# Patient Record
Sex: Female | Born: 2000
Health system: Southern US, Community
[De-identification: ages and names within clinical notes are randomized; demographics above are authoritative.]

## PROBLEM LIST (undated history)

## (undated) DIAGNOSIS — H1010 Acute atopic conjunctivitis, unspecified eye: Secondary | ICD-10-CM

## (undated) DIAGNOSIS — E079 Disorder of thyroid, unspecified: Secondary | ICD-10-CM

## (undated) DIAGNOSIS — L309 Dermatitis, unspecified: Secondary | ICD-10-CM

## (undated) HISTORY — PX: OTHER SURGICAL HISTORY: SHX169

## (undated) HISTORY — PX: WISDOM TOOTH EXTRACTION: SHX21

## (undated) HISTORY — DX: Dermatitis, unspecified: L30.9

## (undated) HISTORY — DX: Acute atopic conjunctivitis, unspecified eye: H10.10

---
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
## 2021-02-05 NOTE — Patient Instructions (Signed)
Preventive Care 18-19 Years Old, Female Preventive care refers to lifestyle choices and visits with your health care provider that can promote health and wellness. At this stage in your life, you may start seeing a primary care physician instead of a pediatrician. It is important to take responsibility for your health and well-being. Preventive care for young adults includes: A yearly physical exam. This is also called an annual wellness visit. Regular dental and eye exams. Immunizations. Screening for certain conditions. Healthy lifestyle choices, such as: Eating a healthy diet. Getting regular exercise. Not using drugs or products that contain nicotine and tobacco. Limiting alcohol use. What can I expect for my preventive care visit? Physical exam Your health care provider may check your: Height and weight. These may be used to calculate your BMI (body mass index). BMI is a measurement that tells if you are at a healthy weight. Heart rate and blood pressure. Body temperature. Skin for abnormal spots. Counseling Your health care provider may ask you questions about your: Past medical problems. Family's medical history. Alcohol, tobacco, and drug use. Home life and relationship well-being. Access to firearms. Emotional well-being. Diet, exercise, and sleep habits. Sexual activity and sexual health. Method of birth control. Menstrual cycle. Pregnancy history. What immunizations do I need? Vaccines are usually given at various ages, according to a schedule. Your health care provider will recommend vaccines for you based on your age, medical history, and lifestyle or other factors, such as travel or where you work. What tests do I need? Blood tests Lipid and cholesterol levels. These may be checked every 5 years starting at age 20. Hepatitis C test. Hepatitis B test. Screening Pelvic exam and Pap test. This may be done every 3 years starting at age 19. STD (sexually transmitted  disease) testing, if you are at risk. BRCA-related cancer screening. This may be done if you have a family history of breast, ovarian, tubal, or peritoneal cancers. Other tests Tuberculosis skin test. Vision and hearing tests. Skin exam. Breast exam. Talk with your health care provider about your test results, treatment options, and if necessary, the need for more tests. Follow these instructions at home: Eating and drinking  Eat a healthy diet that includes fresh fruits and vegetables, whole grains, lean protein, and low-fat dairy products. Drink enough fluid to keep your urine pale yellow. Do not drink alcohol if: Your health care provider tells you not to drink. You are pregnant, may be pregnant, or are planning to become pregnant. You are under the legal drinking age. In the [place] the legal drinking age is 21. If you drink alcohol: Limit how much you use to 0-1 drink a day. Be aware of how much alcohol is in your drink. In the [place] one drink equals one 12 oz bottle of beer (355 mL), one 5 oz glass of wine (148 mL), or one 1 oz glass of hard liquor (44 mL). Lifestyle Take daily care of your teeth and gums. Brush your teeth every morning and night with fluoride toothpaste. Floss one time each day. Stay active. Exercise for at least 30 minutes 5 or more days of the week. Do not use any products that contain nicotine or tobacco, such as cigarettes, e-cigarettes, and chewing tobacco. If you need help quitting, ask your health care provider. Do not use drugs. If you are sexually active, practice safe sex. Use a condom or other form of protection to prevent STIs (sexually transmitted infections). If you do not wish to become pregnant, use   a form of birth control. If you plan to become pregnant, see your health care provider for a prepregnancy visit. Find healthy ways to cope with stress, such as: Meditation, yoga, or listening to music. Journaling. Talking to a trusted person. Spending  time with friends and family. Safety Always wear your seat belt while driving or riding in a vehicle. Do not drive: If you have been drinking alcohol. Do not ride with someone who has been drinking. When you are tired or distracted. While texting. Wear a helmet and other protective equipment during sports activities. If you have firearms in your house, make sure you follow all gun safety procedures. Seek help if you have been bullied, physically abused, or sexually abused. Use the Internet responsibly to avoid dangers, such as online bullying and online sex predators. What's next? Go to your health care provider once a year for an annual wellness visit. Ask your health care provider how often you should have your eyes and teeth checked. Stay up to date on all vaccines. This information is not intended to replace advice given to you by your health care provider. Make sure you discuss any questions you have with your health care provider. Document Revised: 07/01/2020 Document Reviewed: 04/17/2018 Elsevier Patient Education  2022 Elsevier Inc.  

## 2021-02-05 NOTE — Progress Notes (Signed)
Subjective:    Patient ID: Carolyn Decker, female    DOB: [birth year], 20 y.o.   MRN: [id number]  HPI Chief Complaint  Patient presents with   Annual Exam   She is here for a complete physical exam.  Other providers: Endocrine : Dr. Lonzo Cloud  Asthma is controlled.  She continues having recurrent eczema on her hands, worse on the left.  States the triamcinolone is no longer working as well and she plans to see a Armed forces operational officer in [place] or [place]   Social history: Lives with mother, works at [employer] Denies smoking, drinking alcohol, drug use  Diet: good Excerise: not currently   Immunizations: flu shot today.   Health maintenance:   Last Menstrual cycle: this month and regular  Last Dental Exam: in the past week Last Eye Exam: years ago   Wears seatbelt always, uses sunscreen, smoke detectors in home and functioning, does not text while driving and feels safe in home environment.   Reviewed allergies, medications, past medical, surgical, family, and social history.    Review of Systems Review of Systems Constitutional: -fever, -chills, -sweats, -unexpected weight change,-fatigue ENT: -runny nose, -ear pain, -sore throat Cardiology:  -chest pain, -palpitations, -edema Respiratory: -cough, -shortness of breath, -wheezing Gastroenterology: -abdominal pain, -nausea, -vomiting, -diarrhea, -constipation Hematology: -bleeding or bruising problems Musculoskeletal: -arthralgias, -myalgias, -joint swelling, -back pain Ophthalmology: -vision changes Urology: -dysuria, -difficulty urinating, -hematuria, -urinary frequency, -urgency Neurology: -headache, -weakness, -tingling, -numbness       Objective:   Physical Exam BP 108/62 (BP Location: Left Arm, Patient Position: Sitting)   Pulse 65   Ht 5' 7.5" (1.715 m)   Wt 136 lb 3.2 oz (61.8 kg)   LMP 02/02/2021 (Approximate)   SpO2 97%   BMI 21.02 kg/m   General Appearance:    Alert, cooperative, no  distress, appears stated age  Head:    Normocephalic, without obvious abnormality, atraumatic  Eyes:    PERRL, conjunctiva/corneas clear, EOM's intact  Ears:    Normal TM's and external ear canals  Nose: Mask on  Throat: Mask on  Neck:   Supple, no lymphadenopathy;  thyroid:  no   enlargement/tenderness/nodules  Back:    Spine nontender, no curvature, ROM normal, no CVA     tenderness  Lungs:     Clear to auscultation bilaterally without wheezes, rales or     ronchi; respirations unlabored  Chest Wall:    No tenderness or deformity   Heart:    Regular rate and rhythm, S1 and S2 normal, no murmur, rub   or gallop  Breast Exam:  Declines  Abdomen:     Soft, non-tender, nondistended, normoactive bowel sounds,    no masses, no hepatosplenomegaly  Genitalia:  Declines  Rectal:    Not performed due to age<40 and no related complaints  Extremities:   No clubbing, cyanosis or edema  Pulses:   2+ and symmetric all extremities  Skin:   Skin color, texture, turgor normal, no rashes or lesions  Lymph nodes:   Cervical, supraclavicular, and axillary nodes normal  Neurologic:   CNII-XII intact, normal strength, sensation and gait          Psych:   Normal mood, affect, hygiene and grooming.          Assessment & Plan:  Routine general medical examination at a health care facility  HPV vaccine counseling  Irritant hand dermatitis  Mild intermittent asthma without complication  Needs flu shot - Plan: Flu Vaccine QUAD 6+  mos PF IM (Fluarix Quad PF)  She is a pleasant 20 year old female who is here for her CPE and in her usual state of health.  Her mood has been good.  She is currently working at [employer] and plans to start back to school soon.  Preventive health care reviewed.  She will find out if she has had the HPV vaccines and if not, she may follow-up here to get these.  We discussed the importance of these vaccines.  I recommend regular dental exams and eye exams as appropriate.   Counseling on healthy lifestyle including diet and exercise. Asthma is controlled. Intermittent hand dermatitis most likely eczema, triamcinolone no longer working well.  She will call and schedule with a dermatologist and let us know if she needs a referral.

## 2021-02-06 ENCOUNTER — Ambulatory Visit (INDEPENDENT_AMBULATORY_CARE_PROVIDER_SITE_OTHER): Payer: BC Managed Care – PPO | Admitting: Family Medicine

## 2021-02-06 ENCOUNTER — Encounter: Payer: Self-pay | Admitting: Family Medicine

## 2021-02-06 ENCOUNTER — Other Ambulatory Visit: Payer: Self-pay

## 2021-02-06 VITALS — BP 108/62 | HR 65 | Ht 67.5 in | Wt 136.2 lb

## 2021-02-06 DIAGNOSIS — Z23 Encounter for immunization: Secondary | ICD-10-CM

## 2021-02-06 DIAGNOSIS — Z0000 Encounter for general adult medical examination without abnormal findings: Secondary | ICD-10-CM | POA: Diagnosis not present

## 2021-02-06 DIAGNOSIS — L249 Irritant contact dermatitis, unspecified cause: Secondary | ICD-10-CM | POA: Diagnosis not present

## 2021-02-06 DIAGNOSIS — Z7185 Encounter for immunization safety counseling: Secondary | ICD-10-CM | POA: Diagnosis not present

## 2021-02-06 DIAGNOSIS — J4520 Mild intermittent asthma, uncomplicated: Secondary | ICD-10-CM | POA: Diagnosis not present

## 2021-03-24 ENCOUNTER — Other Ambulatory Visit: Payer: Self-pay

## 2021-03-24 DIAGNOSIS — L2082 Flexural eczema: Secondary | ICD-10-CM

## 2021-03-24 DIAGNOSIS — L249 Irritant contact dermatitis, unspecified cause: Secondary | ICD-10-CM

## 2021-03-24 MED ORDER — TRIAMCINOLONE ACETONIDE 0.1 % EX CREA: TOPICAL_CREAM | Freq: Two times a day (BID) | CUTANEOUS | 0 refills | Status: AC

## 2021-06-13 ENCOUNTER — Telehealth: Payer: Self-pay | Admitting: Internal Medicine

## 2021-06-13 NOTE — Telephone Encounter (Signed)
Patient's mother called to cancel 07/18/21 appointment due to: Change of insurance-Patient will not be rescheduling appointment-Patient will be getting treatment at [facility] in the future.

## 2021-07-18 ENCOUNTER — Ambulatory Visit: Payer: BC Managed Care – PPO | Admitting: Internal Medicine

## 2022-03-21 ENCOUNTER — Encounter: Payer: Self-pay | Admitting: Internal Medicine

## 2022-11-13 DIAGNOSIS — F411 Generalized anxiety disorder: Secondary | ICD-10-CM | POA: Diagnosis not present

## 2023-01-01 DIAGNOSIS — F411 Generalized anxiety disorder: Secondary | ICD-10-CM | POA: Diagnosis not present

## 2023-01-05 ENCOUNTER — Ambulatory Visit
Admission: EM | Admit: 2023-01-05 | Discharge: 2023-01-05 | Disposition: A | Discharge status: Home / Self Care | Payer: BC Managed Care – PPO | Attending: Emergency Medicine | Admitting: Emergency Medicine

## 2023-01-05 DIAGNOSIS — N3001 Acute cystitis with hematuria: Secondary | ICD-10-CM

## 2023-01-05 HISTORY — DX: Disorder of thyroid, unspecified: E07.9

## 2023-01-05 LAB — POCT URINALYSIS DIP (MANUAL ENTRY)
Bilirubin, UA: NEGATIVE
Glucose, UA: NEGATIVE mg/dL
Ketones, POC UA: NEGATIVE mg/dL
Nitrite, UA: NEGATIVE
Protein Ur, POC: NEGATIVE mg/dL
Spec Grav, UA: <=1.005 — AB (ref 1.010–1.025)
Urobilinogen, UA: 0.2 E.U./dL
pH, UA: 6.5 (ref 5.0–8.0)

## 2023-01-05 LAB — POCT URINE PREGNANCY: Preg Test, Ur: NEGATIVE

## 2023-01-05 MED ORDER — CIPROFLOXACIN HCL 250 MG PO TABS: 250.0000 mg | ORAL_TABLET | Freq: Two times a day (BID) | ORAL | 0 refills | Status: AC

## 2023-01-05 NOTE — ED Provider Notes (Signed)
Carolyn Decker    CSN: [id number] Arrival date & time: 01/05/23  1304    HISTORY   Chief Complaint  Patient presents with   Dysuria   Urinary Frequency   HPI Carolyn Decker is a pleasant, 22 y.o. female who presents to urgent care today. Patient complains of possible bladder infection. Patient endorses burning during urination, increased frequency of urination, increased urge to urinate, and sensation of incomplete emptying.   Patient denies abnormal vaginal discharge, abnormal vaginal odor, vaginal itching, vaginal irritation, flank pain, fever , body aches, chills, rigors, malaise, significant fatigue, and nausea.   Past Medical History:  Diagnosis Date   Allergic conjunctivitis 12/11/2010   Allergic rhinitis 12/11/2010   Eczema 12/11/2010   Thyroid disease    Patient Active Problem List   Diagnosis Date Noted   Family history of thyroid disease in mother 02/04/2020   Environmental and seasonal allergies 12/29/2019   Irritant hand dermatitis 12/29/2019   Allergic rhinitis 12/11/2010   Allergic conjunctivitis 12/11/2010   Eczema 12/11/2010   Mild intermittent asthma without complication 12/11/2010   Past Surgical History:  Procedure Laterality Date   wisdom teeth     WISDOM TOOTH EXTRACTION     OB History   No obstetric history on file.    Home Medications    Prior to Admission medications   Medication Sig Start Date End Date Taking? Authorizing Provider  ALBUTEROL IN Inhale into the lungs.    [provider]  B Complex Vitamins (B COMPLEX PO) Take by mouth.    [provider]  fexofenadine (ALLEGRA) 180 MG tablet Take 180 mg by mouth daily.    [provider]  levothyroxine (SYNTHROID) 50 MCG tablet Take 1 tablet (50 mcg total) by mouth daily. 07/13/20   Shamleffer, Konrad Dolores, MD  MAGNESIUM CHLORIDE PO Take by mouth.    [provider]  MELATONIN PO Take by mouth.    [provider]  Montelukast Sodium  (SINGULAIR PO) Take by mouth.    [provider]  Nutritional Supplements (VITAMIN D BOOSTER PO) Take by mouth.    [provider]  triamcinolone cream (KENALOG) 0.1 % Apply topically 2 (two) times daily. 03/24/21   Tysinger, Kermit Balo, PA-C  VITAMIN E EX Apply topically.    [provider]    Family History Family History  Problem Relation Age of Onset   Hypothyroidism Mother    Anxiety disorder Mother    Depression Mother    Alcoholism Father    Asperger's syndrome Brother    Heart attack Maternal Grandfather    Heart attack Paternal Grandmother    Social History Social History   Tobacco Use   Smoking status: Never   Smokeless tobacco: Never  Vaping Use   Vaping status: Never Used  Substance Use Topics   Alcohol use: Never   Drug use: Never   Allergies   Shellfish allergy  Review of Systems Review of Systems Pertinent findings revealed after performing a 14 point review of systems has been noted in the history of present illness.  Physical Exam Vital Signs BP 119/78 (BP Location: Right Arm)   Pulse 69   Temp 98.5 F (36.9 C) (Oral)   Resp 18   LMP 12/19/2022 (Approximate)   SpO2 100%   No data found.  Physical Exam  Visual Acuity Right Eye Distance:   Left Eye Distance:   Bilateral Distance:    Right Eye Near:   Left Eye Near:  Bilateral Near:     Decker Couse / Diagnostics / Procedures:     Radiology No results found.  Procedures Procedures (including critical care time) EKG  Pending results:  Labs Reviewed  POCT URINALYSIS DIP (MANUAL ENTRY) - Abnormal; Notable for the following components:      Result Value   Color, UA light yellow (*)    Spec Grav, UA <=1.005 (*)    Blood, UA small (*)    Leukocytes, UA Small (1+) (*)    All other components within normal limits  POCT URINE PREGNANCY    Medications Ordered in Decker: Medications - No data to display  Decker Diagnoses / Final Clinical Impressions(s)   I have  reviewed the triage vital signs and the nursing notes.  Pertinent labs & imaging results that were available during my care of the patient were reviewed by me and considered in my medical decision making (see chart for details).    Final diagnoses:  Acute cystitis with hematuria   Urine dip today revealed microscopic hematuria and pyuria.   Patient was advised to begin antibiotics now due to findings on urine dip. Patient was advised to begin antibiotics today due to having active symptoms of urinary tract infection.                    Patient was advised to take all doses exactly as prescribed.  Patient also advised of risks of worsening infection with incomplete antibiotic therapy. Return precautions advised.  Please see discharge instructions below for details of plan of care as provided to patient. ED Prescriptions     Medication Sig Dispense Auth. Provider   ciprofloxacin (CIPRO) 250 MG tablet Take 1 tablet (250 mg total) by mouth 2 (two) times daily for 5 days. 10 tablet Theadora Rama Scales, PA-C      PDMP not reviewed this encounter.  Disposition Upon Discharge:  Condition: stable for discharge home  Patient presented with concern for an acute illness with associated systemic symptoms and significant discomfort requiring urgent management. In my opinion, this is a condition that a prudent lay person (someone who possesses an average knowledge of health and medicine) may potentially expect to result in complications if not addressed urgently such as respiratory distress, impairment of bodily function or dysfunction of bodily organs.   As such, the patient has been evaluated and assessed, work-up was performed and treatment was provided in alignment with urgent care protocols and evidence based medicine.  Patient/parent/caregiver has been advised that the patient may require follow up for further testing and/or treatment if the symptoms continue in spite of treatment, as  clinically indicated and appropriate.  Routine symptom specific, illness specific and/or disease specific instructions were discussed with the patient and/or caregiver at length.  Prevention strategies for avoiding STD exposure were also discussed.  The patient will follow up with their current PCP if and as advised. If the patient does not currently have a PCP we will assist them in obtaining one.   The patient may need specialty follow up if the symptoms continue, in spite of conservative treatment and management, for further workup, evaluation, consultation and treatment as clinically indicated and appropriate.  Patient/parent/caregiver verbalized understanding and agreement of plan as discussed.  All questions were addressed during visit.  Please see discharge instructions below for further details of plan.  Discharge Instructions:   Discharge Instructions      Common causes of urinary tract infections include but are not limited to holding  your urine longer than you should, squatting instead of sitting down when urinating, sitting around in wet clothing such as a wet swimsuit or gym clothes too long, not emptying your bladder after having sexual intercourse, wiping from back to front instead of front to back after having a bowel movement.     The urinalysis that we performed in the clinic today was abnormal.     You were advised to begin antibiotics today because your urinalysis is abnormal and you are having active symptoms of an acute lower urinary tract infection also known as cystitis.     Please pick up and begin taking your prescription for Ciprofloxacin 250 mg as soon as possible.  Please take all doses exactly as prescribed.  You can take this medication with or without food.  This medication is safe to take with your other medications.   Please consider abstaining from sexual intercourse while you are being treated for urinary tract infection.   If you have not had complete  resolution of your symptoms after completing treatment as prescribed, please return to urgent care for repeat evaluation or follow-up with your primary care provider.  Repeat urinalysis and urine culture may be indicated for more directed therapy.   Thank you for visiting [facility] today.  We appreciate the opportunity to participate in your care.       This office note has been dictated using Teaching laboratory technician.  Unfortunately, this method of dictation can sometimes lead to typographical or grammatical errors.  I apologize for your inconvenience in advance if this occurs.  Please do not hesitate to reach out to me if clarification is needed.       Theadora Rama Scales, [place] 01/05/23 [phone]

## 2023-01-05 NOTE — Discharge Instructions (Signed)
Common causes of urinary tract infections include but are not limited to holding your urine longer than you should, squatting instead of sitting down when urinating, sitting around in wet clothing such as a wet swimsuit or gym clothes too long, not emptying your bladder after having sexual intercourse, wiping from back to front instead of front to back after having a bowel movement.     The urinalysis that we performed in the clinic today was abnormal.     You were advised to begin antibiotics today because your urinalysis is abnormal and you are having active symptoms of an acute lower urinary tract infection also known as cystitis.     Please pick up and begin taking your prescription for Ciprofloxacin 250 mg as soon as possible.  Please take all doses exactly as prescribed.  You can take this medication with or without food.  This medication is safe to take with your other medications.   Please consider abstaining from sexual intercourse while you are being treated for urinary tract infection.   If you have not had complete resolution of your symptoms after completing treatment as prescribed, please return to urgent care for repeat evaluation or follow-up with your primary care provider.  Repeat urinalysis and urine culture may be indicated for more directed therapy.   Thank you for visiting [facility] today.  We appreciate the opportunity to participate in your care.

## 2023-01-05 NOTE — ED Triage Notes (Signed)
Pt c/o urinary frequency and dysuria that began this AM. Also experiencing mild abd cramping and lower back pain.

## 2023-12-01 ENCOUNTER — Other Ambulatory Visit: Payer: Self-pay

## 2023-12-01 ENCOUNTER — Ambulatory Visit
Admission: EM | Admit: 2023-12-01 | Discharge: 2023-12-01 | Disposition: A | Discharge status: Home / Self Care | Attending: Internal Medicine | Admitting: Internal Medicine

## 2023-12-01 DIAGNOSIS — R0981 Nasal congestion: Secondary | ICD-10-CM | POA: Diagnosis not present

## 2023-12-01 DIAGNOSIS — J029 Acute pharyngitis, unspecified: Secondary | ICD-10-CM

## 2023-12-01 DIAGNOSIS — Z20818 Contact with and (suspected) exposure to other bacterial communicable diseases: Secondary | ICD-10-CM

## 2023-12-01 LAB — POC COVID19/FLU A&B COMBO
Covid Antigen, POC: NEGATIVE
Influenza A Antigen, POC: NEGATIVE
Influenza B Antigen, POC: NEGATIVE

## 2023-12-01 LAB — POCT RAPID STREP A (OFFICE): Rapid Strep A Screen: NEGATIVE

## 2023-12-01 MED ORDER — AMOXICILLIN 875 MG PO TABS: 875.0000 mg | ORAL_TABLET | Freq: Two times a day (BID) | ORAL | 0 refills | Status: AC

## 2023-12-01 NOTE — Discharge Instructions (Addendum)
 Your strep test was negative. Recommend salt water gargling and over-the-counter Ibuprofen/Tylenol as needed for pain if you are not allergic. Since you have been exposed to strep and are leaving out of town, I did send a prescription for Amoxicillin  for you to have. If you get worse, I recommend you start the antibiotic.   Return in 2 to 3 days if no improvement. Please go directly to the Emergency Department immediately should you begin to have any of the following symptoms: increased pain, persistent fevers, difficulty swallowing, difficulty talking, drooling or difficulty breathing.

## 2023-12-01 NOTE — ED Triage Notes (Signed)
 C/O nasal congestion and sore throat since last night. Patient took mucinex this morning.

## 2023-12-01 NOTE — ED Provider Notes (Signed)
 [facility]  Note:  This document was prepared using Dragon voice recognition software and may include unintentional dictation errors.  MRN: [id number] DOB: 2000/10/27 DATE: 12/01/23   Subjective:  Chief Complaint:  Chief Complaint  Patient presents with   Nasal Congestion   Sore Throat     HPI: Carolyn Decker is a 23 y.o. female presenting for sore throat and nasal congestion for one day. Reports brother at home with strep throat. She reports taking Mucinex earlier today with relief. She takes Allergra daily for allergies. Patient is concerned because she is leaving out of town this week. Denies fever, nausea/vomiting, abdominal pain, otalgia, cough. Endorses sore throat, nasal congestion. Presents NAD.  Prior to Admission medications   Medication Sig Start Date End Date Taking? Authorizing Provider  amoxicillin  (AMOXIL ) 875 MG tablet Take 1 tablet (875 mg total) by mouth every 12 (twelve) hours for 10 days. 12/01/23 12/11/23 Yes Porshia Blizzard P, PA-C  Ascorbic Acid (VITAMIN C) 1000 MG tablet Take 1,000 mg by mouth daily.   Yes [provider]  B Complex Vitamins (B COMPLEX PO) Take by mouth.    [provider]  fexofenadine (ALLEGRA) 180 MG tablet Take 180 mg by mouth daily.    [provider]  levothyroxine  (SYNTHROID ) 50 MCG tablet Take 1 tablet (50 mcg total) by mouth daily. 07/13/20   Shamleffer, Ibtehal Jaralla, MD  MAGNESIUM CHLORIDE PO Take by mouth.    [provider]  MELATONIN PO Take by mouth.    [provider]  Nutritional Supplements (VITAMIN D BOOSTER PO) Take by mouth.    [provider]  triamcinolone  cream (KENALOG ) 0.1 % Apply topically 2 (two) times daily. 03/24/21   Tysinger, Alm RAMAN, PA-C  VITAMIN E EX Apply topically.    [provider]     Allergies  Allergen Reactions   Shellfish Allergy     History:   Past Medical History:  Diagnosis Date   Allergic conjunctivitis 12/11/2010    Allergic rhinitis 12/11/2010   Eczema 12/11/2010   Thyroid  disease      Past Surgical History:  Procedure Laterality Date   wisdom teeth     WISDOM TOOTH EXTRACTION      Family History  Problem Relation Age of Onset   Hypothyroidism Mother    Anxiety disorder Mother    Depression Mother    Alcoholism Father    Asperger's syndrome Brother    Heart attack Maternal Grandfather    Heart attack Paternal Grandmother     Social History   Tobacco Use   Smoking status: Never   Smokeless tobacco: Never  Vaping Use   Vaping status: Never Used  Substance Use Topics   Alcohol use: Yes    Comment: rarely   Drug use: Not Currently    Review of Systems  Constitutional:  Negative for chills and fever.  HENT:  Positive for congestion and sore throat. Negative for ear pain and rhinorrhea.   Respiratory:  Negative for cough.   Gastrointestinal:  Negative for abdominal pain, nausea and vomiting.     Objective:   Vitals: BP 115/75 (BP Location: Right Arm)   Pulse 82   Temp 99 F (37.2 C) (Oral)   Resp 16   LMP  (LMP Unknown)   SpO2 98%   Physical Exam Constitutional:      General: She is not in acute distress.    Appearance: Normal appearance. She is well-developed and normal weight. She is not ill-appearing  or toxic-appearing.  HENT:     Head: Normocephalic and atraumatic.     Right Ear: Ear canal normal. A middle ear effusion is present.     Left Ear: Ear canal normal. A middle ear effusion is present.     Mouth/Throat:     Pharynx: Uvula midline. Posterior oropharyngeal erythema present. No pharyngeal swelling or oropharyngeal exudate.     Tonsils: No tonsillar exudate or tonsillar abscesses.  Cardiovascular:     Rate and Rhythm: Normal rate and regular rhythm.     Heart sounds: Normal heart sounds.  Pulmonary:     Effort: Pulmonary effort is normal.     Breath sounds: Normal breath sounds.     Comments: Clear to auscultation bilaterally  Abdominal:     General:  Bowel sounds are normal.     Palpations: Abdomen is soft.     Tenderness: There is no abdominal tenderness.  Lymphadenopathy:     Cervical: No cervical adenopathy.     Right cervical: No superficial cervical adenopathy.    Left cervical: No superficial cervical adenopathy.  Skin:    General: Skin is warm and dry.  Neurological:     General: No focal deficit present.     Mental Status: She is alert.  Psychiatric:        Mood and Affect: Mood and affect normal.     Results:  Labs: Results for orders placed or performed during the hospital encounter of 12/01/23 (from the past 24 hours)  POCT rapid strep A     Status: None   Collection Time: 12/01/23  3:54 PM  Result Value Ref Range   Rapid Strep A Screen Negative Negative  POC Covid19/Flu A&B Antigen     Status: None   Collection Time: 12/01/23  3:55 PM  Result Value Ref Range   Influenza A Antigen, POC Negative Negative   Influenza B Antigen, POC Negative Negative   Covid Antigen, POC Negative Negative    Radiology: No results found.   UC Course/Treatments:  Procedures: Procedures   Medications Ordered in UC: Medications - No data to display   Assessment and Plan :     ICD-10-CM   1. Acute pharyngitis, unspecified etiology  J02.9     2. Nasal congestion  R09.81     3. Exposure to strep throat  Z20.818      Acute pharyngitis, unspecified etiology Exposure to strep throat Afebrile, nontoxic-appearing, NAD. VSS. DDX includes but not limited to: strep, mono, viral pharyngitis, post nasal drip Strep and COVID were negative. Suspect viral etiology. However, given that patient was exposed to strep and is leaving out of town, RX was sent for Amoxicillin  875mg  BID. She was instructed not to start the ABX unless symptoms worsened. She was instructed to continue with Mucinex and Recommend salt water gargles and over-the-counter throat spray/lozenges. Patient agreed. Strict ED precautions were given and patient verbalized  understanding.  Nasal congestion Afebrile, nontoxic-appearing, NAD. VSS. DDX includes but not limited to: COVID, flu, viral URI, sinusitis, allergic rhinitis Flu and COVID were negative. Suspect viral etiology. She was instructed to continue with Mucinex. Strict ED precautions were given and patient verbalized understanding.  ED Discharge Orders          Ordered    amoxicillin  (AMOXIL ) 875 MG tablet  Every 12 hours        12/01/23 1558             PDMP not reviewed this encounter.  Jasmene Goswami P, PA-C 12/01/23 1608
